# Patient Record
Sex: Male | Born: 1995 | Race: Black or African American | Hispanic: No | Marital: Single | Smoking: Never smoker
Health system: Southern US, Community
[De-identification: ages and names within clinical notes are randomized; demographics above are authoritative.]

---

## 2017-06-04 DIAGNOSIS — R109 Unspecified abdominal pain: Secondary | ICD-10-CM | POA: Insufficient documentation

## 2017-06-04 DIAGNOSIS — R110 Nausea: Secondary | ICD-10-CM | POA: Insufficient documentation

## 2017-06-04 DIAGNOSIS — Z5321 Procedure and treatment not carried out due to patient leaving prior to being seen by health care provider: Secondary | ICD-10-CM | POA: Insufficient documentation

## 2017-06-05 ENCOUNTER — Encounter (HOSPITAL_COMMUNITY)
Admission: EM | Disposition: A | Discharge status: Home / Self Care | Payer: Self-pay | Source: Home / Self Care | Attending: Emergency Medicine

## 2017-06-05 ENCOUNTER — Encounter (HOSPITAL_COMMUNITY): Payer: Self-pay | Admitting: *Deleted

## 2017-06-05 ENCOUNTER — Observation Stay (HOSPITAL_COMMUNITY): Payer: Self-pay | Admitting: Anesthesiology

## 2017-06-05 ENCOUNTER — Emergency Department (HOSPITAL_COMMUNITY): Payer: Self-pay

## 2017-06-05 ENCOUNTER — Emergency Department (HOSPITAL_COMMUNITY)
Admission: EM | Admit: 2017-06-05 | Discharge: 2017-06-05 | Disposition: A | Discharge status: Home / Self Care | Payer: Self-pay | Attending: Emergency Medicine | Admitting: Emergency Medicine

## 2017-06-05 ENCOUNTER — Observation Stay (HOSPITAL_COMMUNITY)
Admission: EM | Admit: 2017-06-05 | Discharge: 2017-06-06 | Disposition: A | Discharge status: Home / Self Care | Payer: Self-pay | Attending: Surgery | Admitting: Surgery

## 2017-06-05 DIAGNOSIS — K353 Acute appendicitis with localized peritonitis, without perforation or gangrene: Secondary | ICD-10-CM

## 2017-06-05 DIAGNOSIS — K3580 Unspecified acute appendicitis: Secondary | ICD-10-CM | POA: Diagnosis present

## 2017-06-05 HISTORY — PX: LAPAROSCOPIC APPENDECTOMY: SHX408

## 2017-06-05 LAB — COMPREHENSIVE METABOLIC PANEL
ALK PHOS: 71 U/L (ref 38–126)
ALT: 17 U/L (ref 17–63)
ANION GAP: 9 (ref 5–15)
AST: 23 U/L (ref 15–41)
Albumin: 4.7 g/dL (ref 3.5–5.0)
BUN: 11 mg/dL (ref 6–20)
CALCIUM: 10.1 mg/dL (ref 8.9–10.3)
CO2: 27 mmol/L (ref 22–32)
CREATININE: 1.01 mg/dL (ref 0.61–1.24)
Chloride: 100 mmol/L — ABNORMAL LOW (ref 101–111)
Glucose, Bld: 85 mg/dL (ref 65–99)
Potassium: 3.9 mmol/L (ref 3.5–5.1)
SODIUM: 136 mmol/L (ref 135–145)
Total Bilirubin: 0.8 mg/dL (ref 0.3–1.2)
Total Protein: 8.3 g/dL — ABNORMAL HIGH (ref 6.5–8.1)

## 2017-06-05 LAB — URINALYSIS, ROUTINE W REFLEX MICROSCOPIC
Bilirubin Urine: NEGATIVE
Glucose, UA: NEGATIVE mg/dL
HGB URINE DIPSTICK: NEGATIVE
Ketones, ur: NEGATIVE mg/dL
LEUKOCYTES UA: NEGATIVE
NITRITE: NEGATIVE
PROTEIN: NEGATIVE mg/dL
SPECIFIC GRAVITY, URINE: 1.015 (ref 1.005–1.030)
pH: 7 (ref 5.0–8.0)

## 2017-06-05 LAB — CBC
HCT: 45 % (ref 39.0–52.0)
HEMATOCRIT: 45.1 % (ref 39.0–52.0)
HEMOGLOBIN: 14.6 g/dL (ref 13.0–17.0)
HEMOGLOBIN: 15.4 g/dL (ref 13.0–17.0)
MCH: 25.9 pg — ABNORMAL LOW (ref 26.0–34.0)
MCH: 26.9 pg (ref 26.0–34.0)
MCHC: 32.4 g/dL (ref 30.0–36.0)
MCHC: 34.1 g/dL (ref 30.0–36.0)
MCV: 78.8 fL (ref 78.0–100.0)
MCV: 79.8 fL (ref 78.0–100.0)
PLATELETS: 270 10*3/uL (ref 150–400)
Platelets: 264 10*3/uL (ref 150–400)
RBC: 5.64 MIL/uL (ref 4.22–5.81)
RBC: 5.72 MIL/uL (ref 4.22–5.81)
RDW: 13 % (ref 11.5–15.5)
RDW: 13.6 % (ref 11.5–15.5)
WBC: 11.3 10*3/uL — ABNORMAL HIGH (ref 4.0–10.5)
WBC: 11.6 10*3/uL — AB (ref 4.0–10.5)

## 2017-06-05 LAB — LIPASE, BLOOD: LIPASE: 20 U/L (ref 11–51)

## 2017-06-05 SURGERY — APPENDECTOMY, LAPAROSCOPIC
Anesthesia: General | Site: Abdomen

## 2017-06-05 MED ORDER — METOPROLOL TARTRATE 5 MG/5ML IV SOLN: 5.0000 mg | Freq: Four times a day (QID) | INTRAVENOUS | Status: DC | PRN

## 2017-06-05 MED ORDER — KCL IN DEXTROSE-NACL 20-5-0.45 MEQ/L-%-% IV SOLN: INTRAVENOUS | Status: DC

## 2017-06-05 MED ORDER — OXYCODONE HCL 5 MG PO TABS
5.0000 mg | ORAL_TABLET | ORAL | Status: DC | PRN
  Administered 2017-06-05: 5 mg via ORAL
  Filled 2017-06-05: qty 1

## 2017-06-05 MED ORDER — DEXTROSE 5 % IV SOLN
2.0000 g | INTRAVENOUS | Status: DC
  Administered 2017-06-06: 2 g via INTRAVENOUS
  Filled 2017-06-05: qty 2

## 2017-06-05 MED ORDER — IOPAMIDOL (ISOVUE-300) INJECTION 61%
INTRAVENOUS | Status: AC
  Administered 2017-06-05: 100 mL
  Filled 2017-06-05: qty 100

## 2017-06-05 MED ORDER — ONDANSETRON 4 MG PO TBDP: 8.0000 mg | ORAL_TABLET | Freq: Once | ORAL | Status: DC

## 2017-06-05 MED ORDER — ONDANSETRON 4 MG PO TBDP
4.0000 mg | ORAL_TABLET | Freq: Once | ORAL | Status: AC
  Administered 2017-06-05: 4 mg via ORAL

## 2017-06-05 MED ORDER — 0.9 % SODIUM CHLORIDE (POUR BTL) OPTIME
TOPICAL | Status: DC | PRN
  Administered 2017-06-05: 1000 mL

## 2017-06-05 MED ORDER — DOCUSATE SODIUM 100 MG PO CAPS
100.0000 mg | ORAL_CAPSULE | Freq: Two times a day (BID) | ORAL | Status: DC
  Administered 2017-06-05 – 2017-06-06 (×2): 100 mg via ORAL
  Filled 2017-06-05 (×2): qty 1

## 2017-06-05 MED ORDER — PROPOFOL 10 MG/ML IV BOLUS
INTRAVENOUS | Status: DC | PRN
  Administered 2017-06-05: 200 mg via INTRAVENOUS

## 2017-06-05 MED ORDER — MIDAZOLAM HCL 5 MG/5ML IJ SOLN
INTRAMUSCULAR | Status: DC | PRN
  Administered 2017-06-05: 2 mg via INTRAVENOUS

## 2017-06-05 MED ORDER — FENTANYL CITRATE (PF) 100 MCG/2ML IJ SOLN
INTRAMUSCULAR | Status: DC | PRN
  Administered 2017-06-05: 100 ug via INTRAVENOUS
  Administered 2017-06-05 (×3): 50 ug via INTRAVENOUS

## 2017-06-05 MED ORDER — SODIUM CHLORIDE 0.9 % IR SOLN
Status: DC | PRN
  Administered 2017-06-05: 1000 mL

## 2017-06-05 MED ORDER — ACETAMINOPHEN 325 MG PO TABS: 650.0000 mg | ORAL_TABLET | Freq: Four times a day (QID) | ORAL | Status: DC | PRN

## 2017-06-05 MED ORDER — SODIUM CHLORIDE 0.9 % IV BOLUS (SEPSIS)
1000.0000 mL | Freq: Once | INTRAVENOUS | Status: AC
  Administered 2017-06-05: 1000 mL via INTRAVENOUS

## 2017-06-05 MED ORDER — LIDOCAINE HCL (CARDIAC) 20 MG/ML IV SOLN
INTRAVENOUS | Status: DC | PRN
  Administered 2017-06-05: 60 mg via INTRAVENOUS

## 2017-06-05 MED ORDER — DEXTROSE 5 % IV SOLN
1.0000 g | Freq: Once | INTRAVENOUS | Status: AC
  Administered 2017-06-05: 1 g via INTRAVENOUS
  Filled 2017-06-05: qty 10

## 2017-06-05 MED ORDER — DEXTROSE 5 % IV SOLN
2.0000 g | Freq: Once | INTRAVENOUS | Status: AC
  Administered 2017-06-05: 2 g via INTRAVENOUS
  Filled 2017-06-05: qty 2

## 2017-06-05 MED ORDER — ONDANSETRON HCL 4 MG/2ML IJ SOLN: 4.0000 mg | Freq: Four times a day (QID) | INTRAMUSCULAR | Status: DC | PRN

## 2017-06-05 MED ORDER — METRONIDAZOLE IN NACL 5-0.79 MG/ML-% IV SOLN
500.0000 mg | Freq: Once | INTRAVENOUS | Status: AC
  Administered 2017-06-05: 500 mg via INTRAVENOUS
  Filled 2017-06-05: qty 100

## 2017-06-05 MED ORDER — LACTATED RINGERS IV SOLN
INTRAVENOUS | Status: DC
  Administered 2017-06-05 (×2): via INTRAVENOUS

## 2017-06-05 MED ORDER — ROCURONIUM BROMIDE 100 MG/10ML IV SOLN
INTRAVENOUS | Status: DC | PRN
  Administered 2017-06-05: 50 mg via INTRAVENOUS

## 2017-06-05 MED ORDER — ONDANSETRON 4 MG PO TBDP: 4.0000 mg | ORAL_TABLET | Freq: Four times a day (QID) | ORAL | Status: DC | PRN

## 2017-06-05 MED ORDER — KCL IN DEXTROSE-NACL 20-5-0.9 MEQ/L-%-% IV SOLN
INTRAVENOUS | Status: DC
  Administered 2017-06-05: 17:00:00 via INTRAVENOUS
  Filled 2017-06-05 (×2): qty 1000

## 2017-06-05 MED ORDER — PROPOFOL 10 MG/ML IV BOLUS
INTRAVENOUS | Status: AC
  Filled 2017-06-05: qty 20

## 2017-06-05 MED ORDER — ACETAMINOPHEN 650 MG RE SUPP: 650.0000 mg | Freq: Four times a day (QID) | RECTAL | Status: DC | PRN

## 2017-06-05 MED ORDER — FENTANYL CITRATE (PF) 250 MCG/5ML IJ SOLN
INTRAMUSCULAR | Status: AC
  Filled 2017-06-05: qty 5

## 2017-06-05 MED ORDER — BUPIVACAINE-EPINEPHRINE 0.5% -1:200000 IJ SOLN
INTRAMUSCULAR | Status: DC | PRN
  Administered 2017-06-05: 2 mL

## 2017-06-05 MED ORDER — ONDANSETRON 4 MG PO TBDP
ORAL_TABLET | ORAL | Status: AC
  Filled 2017-06-05: qty 2

## 2017-06-05 MED ORDER — ACETAMINOPHEN 500 MG PO TABS
1000.0000 mg | ORAL_TABLET | Freq: Four times a day (QID) | ORAL | Status: DC
  Administered 2017-06-05 – 2017-06-06 (×4): 1000 mg via ORAL
  Filled 2017-06-05 (×4): qty 2

## 2017-06-05 MED ORDER — KCL IN DEXTROSE-NACL 20-5-0.9 MEQ/L-%-% IV SOLN: INTRAVENOUS | Status: DC

## 2017-06-05 MED ORDER — PANTOPRAZOLE SODIUM 40 MG IV SOLR
40.0000 mg | Freq: Every day | INTRAVENOUS | Status: DC
  Administered 2017-06-05: 40 mg via INTRAVENOUS
  Filled 2017-06-05: qty 40

## 2017-06-05 MED ORDER — ONDANSETRON HCL 4 MG/2ML IJ SOLN
INTRAMUSCULAR | Status: DC | PRN
  Administered 2017-06-05: 4 mg via INTRAVENOUS

## 2017-06-05 MED ORDER — MIDAZOLAM HCL 2 MG/2ML IJ SOLN
INTRAMUSCULAR | Status: AC
  Filled 2017-06-05: qty 2

## 2017-06-05 MED ORDER — KETOROLAC TROMETHAMINE 30 MG/ML IJ SOLN
INTRAMUSCULAR | Status: DC | PRN
  Administered 2017-06-05: 30 mg via INTRAVENOUS

## 2017-06-05 MED ORDER — OXYCODONE HCL 5 MG PO TABS: 5.0000 mg | ORAL_TABLET | ORAL | Status: DC | PRN

## 2017-06-05 MED ORDER — ENOXAPARIN SODIUM 40 MG/0.4ML ~~LOC~~ SOLN
40.0000 mg | SUBCUTANEOUS | Status: DC
  Filled 2017-06-05: qty 0.4

## 2017-06-05 MED ORDER — FENTANYL CITRATE (PF) 100 MCG/2ML IJ SOLN: 50.0000 ug | INTRAMUSCULAR | Status: DC | PRN

## 2017-06-05 MED ORDER — METRONIDAZOLE IN NACL 5-0.79 MG/ML-% IV SOLN
500.0000 mg | Freq: Three times a day (TID) | INTRAVENOUS | Status: DC
  Administered 2017-06-05 – 2017-06-06 (×2): 500 mg via INTRAVENOUS
  Filled 2017-06-05 (×4): qty 100

## 2017-06-05 MED ORDER — BUPIVACAINE-EPINEPHRINE (PF) 0.5% -1:200000 IJ SOLN
INTRAMUSCULAR | Status: AC
  Filled 2017-06-05: qty 30

## 2017-06-05 MED ORDER — KETOROLAC TROMETHAMINE 30 MG/ML IJ SOLN
30.0000 mg | Freq: Four times a day (QID) | INTRAMUSCULAR | Status: DC
  Administered 2017-06-05 – 2017-06-06 (×4): 30 mg via INTRAVENOUS
  Filled 2017-06-05 (×4): qty 1

## 2017-06-05 MED ORDER — HEMOSTATIC AGENTS (NO CHARGE) OPTIME
TOPICAL | Status: DC | PRN
  Administered 2017-06-05: 1 via TOPICAL

## 2017-06-05 MED ORDER — SUGAMMADEX SODIUM 200 MG/2ML IV SOLN
INTRAVENOUS | Status: DC | PRN
  Administered 2017-06-05: 100 mg via INTRAVENOUS

## 2017-06-05 MED ORDER — HYDROMORPHONE HCL 1 MG/ML IJ SOLN: 0.5000 mg | INTRAMUSCULAR | Status: DC | PRN

## 2017-06-05 SURGICAL SUPPLY — 40 items
APPLIER CLIP ROT 10 11.4 M/L (STAPLE)
BLADE CLIPPER SURG (BLADE) ×3 IMPLANT
CANISTER SUCT 3000ML PPV (MISCELLANEOUS) ×3 IMPLANT
CHLORAPREP W/TINT 26ML (MISCELLANEOUS) ×3 IMPLANT
CLIP APPLIE ROT 10 11.4 M/L (STAPLE) IMPLANT
COVER SURGICAL LIGHT HANDLE (MISCELLANEOUS) ×3 IMPLANT
CUTTER FLEX LINEAR 45M (STAPLE) ×3 IMPLANT
DERMABOND ADVANCED (GAUZE/BANDAGES/DRESSINGS) ×2
DERMABOND ADVANCED .7 DNX12 (GAUZE/BANDAGES/DRESSINGS) ×1 IMPLANT
DRAPE WARM FLUID 44X44 (DRAPE) IMPLANT
ELECT REM PT RETURN 9FT ADLT (ELECTROSURGICAL) ×3
ELECTRODE REM PT RTRN 9FT ADLT (ELECTROSURGICAL) ×1 IMPLANT
ENDOLOOP SUT PDS II  0 18 (SUTURE)
ENDOLOOP SUT PDS II 0 18 (SUTURE) IMPLANT
GLOVE BIO SURGEON STRL SZ8 (GLOVE) ×3 IMPLANT
GLOVE BIOGEL PI IND STRL 8 (GLOVE) ×1 IMPLANT
GLOVE BIOGEL PI INDICATOR 8 (GLOVE) ×2
GOWN STRL REUS W/ TWL LRG LVL3 (GOWN DISPOSABLE) ×2 IMPLANT
GOWN STRL REUS W/ TWL XL LVL3 (GOWN DISPOSABLE) ×1 IMPLANT
GOWN STRL REUS W/TWL LRG LVL3 (GOWN DISPOSABLE) ×4
GOWN STRL REUS W/TWL XL LVL3 (GOWN DISPOSABLE) ×2
HEMOSTAT SNOW SURGICEL 2X4 (HEMOSTASIS) ×3 IMPLANT
KIT BASIN OR (CUSTOM PROCEDURE TRAY) ×3 IMPLANT
KIT ROOM TURNOVER OR (KITS) ×3 IMPLANT
NS IRRIG 1000ML POUR BTL (IV SOLUTION) ×3 IMPLANT
PAD ARMBOARD 7.5X6 YLW CONV (MISCELLANEOUS) ×6 IMPLANT
POUCH SPECIMEN RETRIEVAL 10MM (ENDOMECHANICALS) ×3 IMPLANT
RELOAD STAPLE TA45 3.5 REG BLU (ENDOMECHANICALS) ×3 IMPLANT
SCISSORS LAP 5X35 DISP (ENDOMECHANICALS) ×3 IMPLANT
SET IRRIG TUBING LAPAROSCOPIC (IRRIGATION / IRRIGATOR) ×3 IMPLANT
SHEARS HARMONIC ACE PLUS 36CM (ENDOMECHANICALS) ×3 IMPLANT
SPECIMEN JAR SMALL (MISCELLANEOUS) ×3 IMPLANT
SUT MON AB 4-0 PC3 18 (SUTURE) ×3 IMPLANT
TOWEL OR 17X24 6PK STRL BLUE (TOWEL DISPOSABLE) ×3 IMPLANT
TOWEL OR 17X26 10 PK STRL BLUE (TOWEL DISPOSABLE) ×3 IMPLANT
TRAY FOLEY CATH SILVER 16FR (SET/KITS/TRAYS/PACK) ×3 IMPLANT
TRAY LAPAROSCOPIC MC (CUSTOM PROCEDURE TRAY) ×3 IMPLANT
TROCAR XCEL BLADELESS 5X75MML (TROCAR) ×6 IMPLANT
TROCAR XCEL BLUNT TIP 100MML (ENDOMECHANICALS) ×3 IMPLANT
TUBING INSUFFLATION (TUBING) ×3 IMPLANT

## 2017-06-05 NOTE — Discharge Instructions (Signed)
Please arrive at least 30 min before your appointment to complete your check in paperwork.  If you are unable to arrive 30 min prior to your appointment time we may have to cancel or reschedule you. ° °LAPAROSCOPIC SURGERY: POST OP INSTRUCTIONS  °1. DIET: Follow a light bland diet the first 24 hours after arrival home, such as soup, liquids, crackers, etc. Be sure to include lots of fluids daily. Avoid fast food or heavy meals as your are more likely to get nauseated. Eat a low fat the next few days after surgery.  °2. Take your usually prescribed home medications unless otherwise directed. °3. PAIN CONTROL:  °1. Pain is best controlled by a usual combination of three different methods TOGETHER:  °1. Ice/Heat °2. Over the counter pain medication °3. Prescription pain medication °2. Most patients will experience some swelling and bruising around the incisions. Ice packs or heating pads (30-60 minutes up to 6 times a day) will help. Use ice for the first few days to help decrease swelling and bruising, then switch to heat to help relax tight/sore spots and speed recovery. Some people prefer to use ice alone, heat alone, alternating between ice & heat. Experiment to what works for you. Swelling and bruising can take several weeks to resolve.  °3. It is helpful to take an over-the-counter pain medication regularly for the first few weeks. Choose one of the following that works best for you:  °1. Naproxen (Aleve, etc) Two 220mg tabs twice a day °2. Ibuprofen (Advil, etc) Three 200mg tabs four times a day (every meal & bedtime) °3. Acetaminophen (Tylenol, etc) 500-650mg four times a day (every meal & bedtime) °4. A prescription for pain medication (such as oxycodone, hydrocodone, etc) should be given to you upon discharge. Take your pain medication as prescribed.  °1. If you are having problems/concerns with the prescription medicine (does not control pain, nausea, vomiting, rash, itching, etc), please call us [phone]  [phone] to see if we need to switch you to a different pain medicine that will work better for you and/or control your side effect better. °2. If you need a refill on your pain medication, please contact your pharmacy. They will contact our office to request authorization. Prescriptions will not be filled after 5 pm or on week-ends. °4. Avoid getting constipated. Between the surgery and the pain medications, it is common to experience some constipation. Increasing fluid intake and taking a fiber supplement (such as Metamucil, Citrucel, FiberCon, MiraLax, etc) 1-2 times a day regularly will usually help prevent this problem from occurring. A mild laxative (prune juice, Milk of Magnesia, MiraLax, etc) should be taken according to package directions if there are no bowel movements after 48 hours.  °5. Watch out for diarrhea. If you have many loose bowel movements, simplify your diet to bland foods & liquids for a few days. Stop any stool softeners and decrease your fiber supplement. Switching to mild anti-diarrheal medications (Kayopectate, Pepto Bismol) can help. If this worsens or does not improve, please call us. °6. Wash / shower every day. You may shower over the dressings as they are waterproof. Continue to shower over incision(s) after the dressing is off. °7. Remove your waterproof bandages 5 days after surgery. You may leave the incision open to air. You may replace a dressing/Band-Aid to cover the incision for comfort if you wish.  °8. ACTIVITIES as tolerated:  °1. You may resume regular (light) daily activities beginning the next day--such as daily self-care, walking, climbing stairs--gradually   increasing activities as tolerated. If you can walk 30 minutes without difficulty, it is safe to try more intense activity such as jogging, treadmill, bicycling, low-impact aerobics, swimming, etc. °2. Save the most intensive and strenuous activity for last such as sit-ups, heavy lifting, contact sports, etc Refrain  from any heavy lifting or straining until you are off narcotics for pain control.  °3. DO NOT PUSH THROUGH PAIN. Let pain be your guide: If it hurts to do something, don't do it. Pain is your body warning you to avoid that activity for another week until the pain goes down. °4. You may drive when you are no longer taking prescription pain medication, you can comfortably wear a seatbelt, and you can safely maneuver your car and apply brakes. °5. You may have sexual intercourse when it is comfortable.  °9. FOLLOW UP in our office  °1. Please call [facility] at [phone] to set up an appointment to see your surgeon in the office for a follow-up appointment approximately 2-3 weeks after your surgery. °2. Make sure that you call for this appointment the day you arrive home to insure a convenient appointment time. °     10. IF YOU HAVE DISABILITY OR FAMILY LEAVE FORMS, BRING THEM TO THE               OFFICE FOR PROCESSING.  ° °WHEN TO CALL US [phone]:  °1. Poor pain control °2. Reactions / problems with new medications (rash/itching, nausea, etc)  °3. Fever over 101.5 F (38.5 C) °4. Inability to urinate °5. Nausea and/or vomiting °6. Worsening swelling or bruising °7. Continued bleeding from incision. °8. Increased pain, redness, or drainage from the incision ° °The clinic staff is available to answer your questions during regular business hours (8:30am-5pm). Please don’t hesitate to call and ask to speak to one of our nurses for clinical concerns.  °If you have a medical emergency, go to the nearest emergency room or call 911.  °A surgeon from [facility] is always on call at the hospitals  ° °[facility]  °[address], [address], [place], [place] [address] ?  °MAIN: [phone] ? TOLL FREE: [phone] ?  °FAX [phone]  °[email or web address] ° °

## 2017-06-05 NOTE — Transfer of Care (Signed)
Immediate Anesthesia Transfer of Care Note  Patient: Debby Bud Chittenden  Procedure(s) Performed: Procedure(s): APPENDECTOMY LAPAROSCOPIC (N/A)  Patient Location: PACU  Anesthesia Type:General  Level of Consciousness: sedated  Airway & Oxygen Therapy: Patient Spontanous Breathing and Patient connected to nasal cannula oxygen  Post-op Assessment: Report given to RN, Post -op Vital signs reviewed and stable and Patient moving all extremities  Post vital signs: Reviewed and stable  Last Vitals:  Vitals:   06/05/17 1015 06/05/17 1458  BP: (!) 142/86   Pulse: 87 (P) 77  Resp:  (!) (P) 21  Temp:  (P) 36.4 C  SpO2: 98% (P) 94%    Last Pain:  Vitals:   06/05/17 0831  TempSrc:   PainSc: 5          Complications: No apparent anesthesia complications

## 2017-06-05 NOTE — Interval H&P Note (Signed)
History and Physical Interval Note:  06/05/2017 1:20 PM  Randy Smith  has presented today for surgery, with the diagnosis of acute appendicitis  The various methods of treatment have been discussed with the patient and family. After consideration of risks, benefits and other options for treatment, the patient has consented to  Procedure(s): APPENDECTOMY LAPAROSCOPIC (N/A) as a surgical intervention .  The patient's history has been reviewed, patient examined, no change in status, stable for surgery.  I have reviewed the patient's chart and labs.  Questions were answered to the patient's satisfaction.     Embry Manrique A.

## 2017-06-05 NOTE — ED Notes (Signed)
Surgical PA at bedside  

## 2017-06-05 NOTE — ED Notes (Signed)
Consent signed and left at bedside with patient, patient instructed to undress prior to going up for surgery.

## 2017-06-05 NOTE — ED Notes (Signed)
Patient transported to CT 

## 2017-06-05 NOTE — ED Triage Notes (Signed)
The pt is c/o abd pain for 2 hours with nausea

## 2017-06-05 NOTE — Anesthesia Preprocedure Evaluation (Signed)
Anesthesia Evaluation  Patient identified by MRN, date of birth, ID band Patient awake    Reviewed: Allergy & Precautions, NPO status , Patient's Chart, lab work & pertinent test results  Airway Mallampati: II  TM Distance: >3 FB Neck ROM: Full    Dental no notable dental hx.    Pulmonary neg pulmonary ROS,    Pulmonary exam normal breath sounds clear to auscultation       Cardiovascular negative cardio ROS Normal cardiovascular exam Rhythm:Regular Rate:Normal     Neuro/Psych negative neurological ROS  negative psych ROS   GI/Hepatic negative GI ROS, Neg liver ROS,   Endo/Other  negative endocrine ROS  Renal/GU negative Renal ROS     Musculoskeletal negative musculoskeletal ROS (+)   Abdominal   Peds  Hematology negative hematology ROS (+)   Anesthesia Other Findings   Reproductive/Obstetrics                             Anesthesia Physical Anesthesia Plan  ASA: I and emergent  Anesthesia Plan: General   Post-op Pain Management:    Induction: Intravenous and Cricoid pressure planned  PONV Risk Score and Plan: 3 and Ondansetron, Dexamethasone and Midazolam  Airway Management Planned: Oral ETT  Additional Equipment:   Intra-op Plan:   Post-operative Plan: Extubation in OR  Informed Consent: I have reviewed the patients History and Physical, chart, labs and discussed the procedure including the risks, benefits and alternatives for the proposed anesthesia with the patient or authorized representative who has indicated his/her understanding and acceptance.   Dental advisory given  Plan Discussed with: CRNA  Anesthesia Plan Comments:         Anesthesia Quick Evaluation

## 2017-06-05 NOTE — ED Provider Notes (Signed)
[facility]-EMERGENCY DEPT Provider Note   CSN: [id number] Arrival date & time: 06/05/17  4098     History   Chief Complaint Chief Complaint  Patient presents with  . Abdominal Pain    HPI Randy Smith is a 21 y.o. male.  Pt presents to the ED today with RLQ abd pain.  Pain started around 1800 yesterday.  He initially came in around midnight, but due to the long wait, he went home.  Blood was drawn while he was in the waiting room.  Pt left, then came back this morning with continued pain.        History reviewed. No pertinent past medical history.  There are no active problems to display for this patient.   History reviewed. No pertinent surgical history.     Home Medications    Prior to Admission medications   Medication Sig Start Date End Date Taking? Authorizing Provider  aspirin 325 MG tablet Take 650 mg by mouth once.   Yes [provider]  ibuprofen (ADVIL,MOTRIN) 200 MG tablet Take 400 mg by mouth every 6 (six) hours as needed for headache or mild pain.   Yes [provider]    Family History History reviewed. No pertinent family history.  Social History Social History  Substance Use Topics  . Smoking status: Never Smoker  . Smokeless tobacco: Never Used  . Alcohol use Yes     Allergies   Patient has no known allergies.   Review of Systems Review of Systems  Gastrointestinal: Positive for abdominal pain.  All other systems reviewed and are negative.    Physical Exam Updated Vital Signs BP (!) 142/86   Pulse 87   Temp 98.8 F (37.1 C) (Oral)   Resp 20   Ht 6' (1.829 m)   Wt 81.6 kg (180 lb)   SpO2 98%   BMI 24.41 kg/m   Physical Exam  Constitutional: He is oriented to person, place, and time. He appears well-developed and well-nourished.  HENT:  Head: Normocephalic and atraumatic.  Right Ear: External ear normal.  Left Ear: External ear normal.  Nose: Nose normal.  Mouth/Throat: Oropharynx is clear and moist.    Eyes: Pupils are equal, round, and reactive to light. Conjunctivae and EOM are normal.  Neck: Normal range of motion. Neck supple.  Cardiovascular: Normal rate, regular rhythm, normal heart sounds and intact distal pulses.   Pulmonary/Chest: Effort normal and breath sounds normal.  Abdominal: There is tenderness in the right lower quadrant.  Musculoskeletal: Normal range of motion.  Neurological: He is alert and oriented to person, place, and time.  Skin: Skin is warm.  Psychiatric: He has a normal mood and affect. His behavior is normal. Judgment and thought content normal.  Nursing note and vitals reviewed.    ED Treatments / Results  Labs (all labs ordered are listed, but only abnormal results are displayed) Labs Reviewed  CBC - Abnormal; Notable for the following:       Result Value   WBC 11.3 (*)    All other components within normal limits    EKG  EKG Interpretation None       Radiology Ct Abdomen Pelvis W Contrast  Result Date: 06/05/2017 CLINICAL DATA:  Sharp pains in the lower abdomen on both sides, worse on the right. Symptoms began 6 p.m. yesterday. EXAM: CT ABDOMEN AND PELVIS WITH CONTRAST TECHNIQUE: Multidetector CT imaging of the abdomen and pelvis was performed using the standard protocol following bolus administration of intravenous contrast.  CONTRAST:  ISOVUE-300 IOPAMIDOL (ISOVUE-300) INJECTION 61% COMPARISON:  None. FINDINGS: Lower chest:  No contributory findings. Hepatobiliary: No focal liver abnormality.No evidence of biliary obstruction or stone. Pancreas: Unremarkable. Spleen: Unremarkable. Adrenals/Urinary Tract: Negative adrenals. No hydronephrosis or stone. Unremarkable bladder. Stomach/Bowel: The appendix extends medially then inferiorly from the cecum and shows wall thickening within outer wall diameter of 11 mm. Some gas is present within the distal lumen, but it is primarily fluid-filled. Stone like calcification seen at the level of the cecum  but not at the appendiceal orifice. No mesoappendix fat inflammation. No perforation or abscess. Negative for bowel obstruction. Vascular/Lymphatic: No acute vascular abnormality. Mild enlargement of ileo colic lymph nodes considered reactive in this setting. Reproductive:No pathologic findings. Other: No ascites or pneumoperitoneum. Musculoskeletal: No acute abnormalities. Two retained BBs posterior to the left proximal femur. IMPRESSION: Findings consistent with early appendicitis in the appropriate clinical setting. No perforation or abscess. Electronically Signed   By: Marnee Spring M.D.   On: 06/05/2017 10:40    Procedures Procedures (including critical care time)  Medications Ordered in ED Medications  ondansetron (ZOFRAN-ODT) 4 MG disintegrating tablet (not administered)  cefTRIAXone (ROCEPHIN) 2 g in dextrose 5 % 50 mL IVPB (not administered)    And  metroNIDAZOLE (FLAGYL) IVPB 500 mg (not administered)  ondansetron (ZOFRAN-ODT) disintegrating tablet 4 mg (4 mg Oral Given 06/05/17 0655)  sodium chloride 0.9 % bolus 1,000 mL (1,000 mLs Intravenous New Bag/Given 06/05/17 0840)  iopamidol (ISOVUE-300) 61 % injection (100 mLs  Contrast Given 06/05/17 1024)     Initial Impression / Assessment and Plan / ED Course  I have reviewed the triage vital signs and the nursing notes.  Pertinent labs & imaging results that were available during my care of the patient were reviewed by me and considered in my medical decision making (see chart for details).   Pt has acute appendicitis on CT.  He was given rocephin and flagyl.  He was d/w surgery who will see him and take him to the OR.   Final Clinical Impressions(s) / ED Diagnoses   Final diagnoses:  Acute appendicitis with localized peritonitis    New Prescriptions New Prescriptions   No medications on file     Jacalyn Lefevre, MD 06/05/17 1109

## 2017-06-05 NOTE — Op Note (Signed)
Appendectomy, Lap, Procedure Note  Indications: The patient presented with a history of right-sided abdominal pain. A CT revealed findings consistent with acute appendicitis. The procedure has been discussed with the patient.  Alternative therapies have been discussed with the patient.  Operative risks include bleeding,  Infection,  Organ injury,  Nerve injury,  Blood vessel injury,  DVT,  Pulmonary embolism,  Death,  And possible reoperation.  Medical management risks include worsening of present situation.  The success of the procedure is 50 -90 % at treating patients symptoms.  The patient understands and agrees to proceed.       Pre-operative Diagnosis: Acute appendicitis without mention of peritonitis  Post-operative Diagnosis: Same  Surgeon: Detron Carras A.   Assistants: NONE   Anesthesia: General endotracheal anesthesia and Local anesthesia 0.25.% bupivacaine, with epinephrine  ASA Class: 1  Procedure Details  The patient was seen again in the Holding Room. The risks, benefits, complications, treatment options, and expected outcomes were discussed with the patient and/or family. The possibilities of reaction to medication, pulmonary aspiration, perforation of viscus, bleeding, recurrent infection, finding a normal appendix, the need for additional procedures, failure to diagnose a condition, and creating a complication requiring transfusion or operation were discussed. There was concurrence with the proposed plan and informed consent was obtained. The site of surgery was properly noted/marked. The patient was taken to Operating Room, identified as Randy Smith and the procedure verified as Appendectomy. A Time Out was held and the above information confirmed.  The patient was placed in the supine position and general anesthesia was induced, along with placement of orogastric tube, Venodyne boots, and a Foley catheter. The abdomen was prepped and draped in a sterile fashion. A one  centimeter infraumbilical incision was made and the peritoneal cavity was accessed using the OPEN  technique. The pneumoperitoneum was then established to steady pressure of 12 mmHg. A 12 mm port was placed through the umbilical incision. Additional 5 mm cannulas then placed in the midline  of the abdomen and half way between the umbilicus and xyphoid under direct vision. A careful evaluation of the entire abdomen was carried out. The patient was placed in Trendelenburg and left lateral decubitus position. The small intestines were retracted in the cephalad and left lateral direction away from the pelvis and right lower quadrant. The patient was found to have an enlarged and inflamed appendix that was extending into the pelvis. There was no evidence of perforation.  The appendix was carefully dissected. A window was made in the mesoappendix at the base of the appendix. A harmonic scalpel was used across the mesoappendix. The appendix was divided at its base using an endo-GIA stapler. Minimal appendiceal stump was left in place. There was no evidence of bleeding, leakage, or complication after division of the appendix. Irrigation was also performed and irrigate suctioned from the abdomen as well.  The umbilical port site was closed using 0 vicryl pursestring sutures fashion at the level of the fascia. The trocar site skin wounds were closed using skin staples.  Instrument, sponge, and needle counts were correct at the conclusion of the case.   Findings: The appendix was found to be inflamed. There were not signs of necrosis.  There was not perforation. There was not abscess formation.  Estimated Blood Loss:  less than 50 mL         Drains: NONE         Total IV Fluids: PER ANESTHESIA  RECORD  Specimens: APPENDIX          Complications:  None; patient tolerated the procedure well.         Disposition: PACU - hemodynamically stable.         Condition: stable

## 2017-06-05 NOTE — ED Triage Notes (Signed)
C/o abd. Pain onset yest pm, states he was here earlier however didn;t want to wait, and left.

## 2017-06-05 NOTE — H&P (Signed)
[facility] Admission Note  Randy Smith [birth year]  [id number].    Requesting MD: Particia Nearing Chief Complaint/Reason for Consult: Acute appendicitis HPI:  Patient is a 21 y.o. Male who is here visiting from [place], [place]. He began experiencing sharp abdominal pain in his RLQ yesterday around 6-7 PM. Pain is constant, radiates across abdomen, exacerbated by movement. Associated nausea, vomiting with PO contrast. Denies fever, chills, chest pain, SOB, diarrhea, fatigue. 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 No other surgeries. Otherwise healthy, no daily medications. Patient reports occasional alcohol use, no tobacco use, no illicit drug use. 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   ROS: Review of Systems  Constitutional: Negative for chills, fever and malaise/fatigue.  Respiratory: Negative for cough, shortness of breath and wheezing.   Cardiovascular: Negative for chest pain and palpitations.  Gastrointestinal: Positive for abdominal pain (RLQ), nausea and vomiting. Negative for blood in stool, constipation and diarrhea.  Musculoskeletal: Negative for back pain and neck pain.  Neurological: Negative for dizziness and loss of consciousness.  All other systems reviewed and are negative.   History reviewed. No pertinent family history.  History reviewed. No pertinent past medical history.  History reviewed. No pertinent surgical history.  Social History:  reports that he has never smoked. He has never used smokeless tobacco. He reports that he drinks alcohol. His drug history is not on file.  Allergies: No Known Allergies   (Not in a hospital admission)  Blood pressure (!) 142/86, pulse 87, temperature 98.8 F (37.1 C), temperature source Oral, resp. rate 20, height 6' (1.829 m), weight 81.6 kg (180 lb), SpO2 98 %. Physical Exam: Physical Exam  Constitutional: He is oriented to person, place, and time. He appears  well-developed and well-nourished. He is cooperative.  Non-toxic appearance. No distress.  HENT:  Head: Normocephalic and atraumatic.  Right Ear: External ear normal.  Left Ear: External ear normal.  Nose: Nose normal.  Mouth/Throat: Mucous membranes are normal.  Eyes: Conjunctivae and lids are normal. No scleral icterus.  Neck: Normal range of motion. Neck supple. No thyromegaly present.  Cardiovascular: Normal rate, regular rhythm, S1 normal and S2 normal.   Pulses:      Radial pulses are 2+ on the right side, and 2+ on the left side.       Dorsalis pedis pulses are 2+ on the right side, and 2+ on the left side.  No lower extremity edema bilaterally  Pulmonary/Chest: Effort normal and breath sounds normal.  Abdominal: Soft. Normal appearance and bowel sounds are normal. He exhibits no distension and no mass. There is no hepatosplenomegaly. There is tenderness in the right lower quadrant. There is tenderness at McBurney's point. There is no rigidity, no rebound and no guarding. No hernia.  Musculoskeletal:  ROM intact in bilateral upper and lower extremities. No gross deformity in bilateral upper and lower extremities.   Neurological: He is alert and oriented to person, place, and time. He has normal strength. No sensory deficit.  Skin: Skin is warm, dry and intact. No rash noted. He is not diaphoretic.  Psychiatric: He has a normal mood and affect. His speech is normal and behavior is normal.    Results for orders placed or performed during the hospital encounter of 06/05/17 (from the past 48 hour(s))  CBC     Status: Abnormal   Collection Time: 06/05/17  8:41 AM  Result Value Ref Range   WBC 11.3 (H) 4.0 -  10.5 K/uL   RBC 5.72 4.22 - 5.81 MIL/uL   Hemoglobin 15.4 13.0 - 17.0 g/dL   HCT 04.5 40.9 - 81.1 %   MCV 78.8 78.0 - 100.0 fL   MCH 26.9 26.0 - 34.0 pg   MCHC 34.1 30.0 - 36.0 g/dL   RDW 91.4 78.2 - 95.6 %   Platelets 264 150 - 400 K/uL   Ct Abdomen Pelvis W  Contrast  Result Date: 06/05/2017 CLINICAL DATA:  Sharp pains in the lower abdomen on both sides, worse on the right. Symptoms began 6 p.m. yesterday. EXAM: CT ABDOMEN AND PELVIS WITH CONTRAST TECHNIQUE: Multidetector CT imaging of the abdomen and pelvis was performed using the standard protocol following bolus administration of intravenous contrast. CONTRAST:  ISOVUE-300 IOPAMIDOL (ISOVUE-300) INJECTION 61% COMPARISON:  None. FINDINGS: Lower chest:  No contributory findings. Hepatobiliary: No focal liver abnormality.No evidence of biliary obstruction or stone. Pancreas: Unremarkable. Spleen: Unremarkable. Adrenals/Urinary Tract: Negative adrenals. No hydronephrosis or stone. Unremarkable bladder. Stomach/Bowel: The appendix extends medially then inferiorly from the cecum and shows wall thickening within outer wall diameter of 11 mm. Some gas is present within the distal lumen, but it is primarily fluid-filled. Stone like calcification seen at the level of the cecum but not at the appendiceal orifice. No mesoappendix fat inflammation. No perforation or abscess. Negative for bowel obstruction. Vascular/Lymphatic: No acute vascular abnormality. Mild enlargement of ileo colic lymph nodes considered reactive in this setting. Reproductive:No pathologic findings. Other: No ascites or pneumoperitoneum. Musculoskeletal: No acute abnormalities. Two retained BBs posterior to the left proximal femur. IMPRESSION: Findings consistent with early appendicitis in the appropriate clinical setting. No perforation or abscess. Electronically Signed   By: Marnee Spring M.D.   On: 06/05/2017 10:40      Assessment/Plan Acute appendicitis - CT: appendix extends medially then inferiorly from the cecum and shows wall thickening within outer wall diameter of 11 mm. No mesoappendix fat inflammation. No perforation or abscess - WBC 11.3, afebrile - clinically has signs of acute appendicitis - NPO, IV abx, consent patient    FEN: NPO, IVF VTE: SCDs ID: ceftriaxone and flagyl  Plan: to OR later today for laparoscopic appendectomy  Wells Guiles, [facility] 06/05/2017, 11:24 AM Pager: [phone] Consults: [phone] Mon-Fri 7:00 am-4:30 pm Sat-Sun 7:00 am-11:30 am

## 2017-06-05 NOTE — Anesthesia Procedure Notes (Signed)
Procedure Name: Intubation Date/Time: 06/05/2017 1:58 PM Performed by: Clearnce Sorrel Pre-anesthesia Checklist: Patient identified, Emergency Drugs available, Suction available, Patient being monitored and Timeout performed Patient Re-evaluated:Patient Re-evaluated prior to induction Oxygen Delivery Method: Circle system utilized Preoxygenation: Pre-oxygenation with 100% oxygen Induction Type: IV induction and Cricoid Pressure applied Ventilation: Mask ventilation without difficulty Laryngoscope Size: Mac and 3 Grade View: Grade I Tube type: Oral Tube size: 7.5 mm Number of attempts: 1 Airway Equipment and Method: Stylet Placement Confirmation: ETT inserted through vocal cords under direct vision,  positive ETCO2 and breath sounds checked- equal and bilateral Secured at: 23 cm Tube secured with: Tape Dental Injury: Teeth and Oropharynx as per pre-operative assessment

## 2017-06-06 ENCOUNTER — Encounter (HOSPITAL_COMMUNITY): Payer: Self-pay

## 2017-06-06 LAB — HIV ANTIBODY (ROUTINE TESTING W REFLEX): HIV SCREEN 4TH GENERATION: NONREACTIVE

## 2017-06-06 LAB — BASIC METABOLIC PANEL
Anion gap: 4 — ABNORMAL LOW (ref 5–15)
BUN: 8 mg/dL (ref 6–20)
CHLORIDE: 108 mmol/L (ref 101–111)
CO2: 26 mmol/L (ref 22–32)
CREATININE: 1.12 mg/dL (ref 0.61–1.24)
Calcium: 9.1 mg/dL (ref 8.9–10.3)
GFR calc Af Amer: >60 mL/min (ref >60)
GFR calc non Af Amer: >60 mL/min (ref >60)
GLUCOSE: 91 mg/dL (ref 65–99)
POTASSIUM: 4.1 mmol/L (ref 3.5–5.1)
SODIUM: 138 mmol/L (ref 135–145)

## 2017-06-06 MED ORDER — OXYCODONE HCL 5 MG PO TABS: 5.0000 mg | ORAL_TABLET | ORAL | 0 refills | Status: AC | PRN

## 2017-06-06 NOTE — Progress Notes (Signed)
Discharge instructions gone over with patient. Home medications discussed. Prescription given. Follow up appointment to be made.  Diet,activity,  incisional care, and reasons to call the doctor discussed. Bowel regimen discussed. Patient verbalized understanding of instructions.

## 2017-06-06 NOTE — Discharge Summary (Signed)
Patient ID: Randy Smith [id number] 21 y.o. [birth year]  Admit date: 06/05/2017  Discharge date and time: 06/06/2017  Admitting Physician: Ernestene Mention  Discharge Physician: Ernestene Mention  Admission Diagnoses: Acute appendicitis with localized peritonitis [K35.3]  Discharge Diagnoses: same  Operations: Procedure(s): APPENDECTOMY LAPAROSCOPIC  Admission Condition: fair  Discharged Condition: good  Indication for Admission: Patient is a 21 y.o. Male who is here visiting from [place], [place]. He began experiencing sharp abdominal pain in his RLQ on 06/04/2017 around 6-7 PM. Pain is constant, radiates across abdomen, exacerbated by movement. Associated nausea, vomiting with PO contrast. Denies fever, chills, chest pain, SOB, diarrhea, fatigue. Patient had surgery for undescended testicle at age 71 or 63. No other surgeries. Otherwise healthy, no daily medications. Patient reports 21y.o. occasional alcohol use, no tobacco use, no illicit drug use. 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   Hospital Course: the patient was evaluated in the emergency department.abdominal exam revealed localized tenderness the right lower quadrant.  WBC 11,300.  CT scan showed thickened inflamed appendix without perforation or abscess.  He was given IV fluids and intravenous antibiotics.  He was taking operating room and underwent laparoscopic appendectomy.  There is no signs of gangrene or perforation.  Surgery was uneventful.  He was observed overnight and did very well.  He became ambulatory.  Had no trouble voiding.  Ate a regular breakfast and asked to go home.  Examination of the day of discharge revealed he was alert and friendly no distress.  Abdomen was soft.  All of the wounds looked good with minimal tenderness.  He was given instructions in diet and activities.  He was given a prescription for Percocet for pain.  He was asked to return to see [place] in the office in 2 weeks and an  appointment was made.  Consults: None  Significant Diagnostic Studies: surgical pathology, pending  Treatments:  and surgery: laparoscopic appendectomy  Disposition: Home  Patient Instructions:  Allergies as of 06/06/2017   No Known Allergies     Medication List    TAKE these medications   aspirin 325 MG tablet Take 650 mg by mouth once.   ibuprofen 200 MG tablet Commonly known as:  ADVIL,MOTRIN Take 400 mg by mouth every 6 (six) hours as needed for headache or mild pain.   oxyCODONE 5 MG immediate release tablet Commonly known as:  Oxy IR/ROXICODONE Take 1-2 tablets (5-10 mg total) by mouth every 4 (four) hours as needed for moderate pain.            Discharge Care Instructions        Start     Ordered   06/06/17 0000  oxyCODONE (OXY IR/ROXICODONE) 5 MG immediate release tablet  Every 4 hours PRN     06/06/17 1131   06/06/17 0000  Increase activity slowly     06/06/17 1131   06/06/17 0000  Diet - low sodium heart healthy     06/06/17 1131   06/06/17 0000  Discharge instructions    Comments:  [facility] ______[facility] LAPAROSCOPIC SURGERY: POST OP INSTRUCTIONS Always review your discharge instruction sheet given to you by the facility where your surgery was performed. IF YOU HAVE DISABILITY OR FAMILY LEAVE FORMS, YOU MUST BRING THEM TO THE OFFICE FOR PROCESSING.   DO NOT GIVE THEM TO YOUR DOCTOR.  A prescription for pain medication may be given to you upon discharge.  Take your pain medication as prescribed, if needed.  If  narcotic pain medicine is not needed, then you may take acetaminophen (Tylenol) or ibuprofen (Advil) as needed. Take your usually prescribed medications unless otherwise directed. If you need a refill on your pain medication, please contact your pharmacy.  They will contact our office to request authorization. Prescriptions will not be filled after 5pm or on week-ends. You should follow a light diet the first few days after  arrival home, such as soup and crackers, etc.  Be sure to include lots of fluids daily. Most patients will experience some swelling and bruising in the area of the incisions.  Ice packs will help.  Swelling and bruising can take several days to resolve.  It is common to experience some constipation if taking pain medication after surgery.  Increasing fluid intake and taking a stool softener (such as Colace) will usually help or prevent this problem from occurring.  A mild laxative (Milk of Magnesia or Miralax) should be taken according to package instructions if there are no bowel movements after 48 hours. Unless discharge instructions indicate otherwise, you may remove your bandages 24-48 hours after surgery, and you may shower at that time.  You may have steri-strips (small skin tapes) in place directly over the incision.  These strips should be left on the skin for 7-10 days.  If your surgeon used skin glue on the incision, you may shower in 24 hours.  The glue will flake off over the next 2-3 weeks.  Any sutures or staples will be removed at the office during your follow-up visit. ACTIVITIES:  You may resume regular (light) daily activities beginning the next day-such as daily self-care, walking, climbing stairs-gradually increasing activities as tolerated.  You may have sexual intercourse when it is comfortable.  Refrain from any heavy lifting or straining until approved by your doctor. You may drive when you are no longer taking prescription pain medication, you can comfortably wear a seatbelt, and you can safely maneuver your car and apply brakes. RETURN TO WORK:  __________________________________________________________ Bonita Quin should see your doctor in the office for a follow-up appointment approximately 2-3 weeks after your surgery.  Make sure that you call for this appointment within a day or two after you arrive home to insure a convenient appointment time. OTHER INSTRUCTIONS:  __________________________________________________________________________________________________________________________ __________________________________________________________________________________________________________________________ WHEN TO CALL YOUR DOCTOR: Fever over 101.0 Inability to urinate Continued bleeding from incision. Increased pain, redness, or drainage from the incision. Increasing abdominal pain  The clinic staff is available to answer your questions during regular business hours.  Please don't hesitate to call and ask to speak to one of the nurses for clinical concerns.  If you have a medical emergency, go to the nearest emergency room or call 911.  A surgeon from [facility] is always on call at the hospital. [address], [place], [place]  [address] ? [address] [address], [place], [place]   [address] [phone] ? [phone] ? FAX [phone] Web site: [email or web address]   06/06/17 1131   06/06/17 0000  May walk up steps     06/06/17 1131   06/06/17 0000  May shower / [place]     06/06/17 1131   06/06/17 0000  Driving Restrictions    Comments:  2-4 days   06/06/17 1131   06/06/17 0000  Lifting restrictions    Comments:  20 lbs for 3 weeks   06/06/17 1131   06/06/17 0000  No wound care     06/06/17 1131   06/06/17 0000  Call MD for:  persistant dizziness or light-headedness     06/06/17 1131   06/06/17 0000  Call MD for:  hives     06/06/17 1131   06/06/17 0000  Call MD for:  difficulty breathing, headache or visual disturbances     06/06/17 1131   06/06/17 0000  Call MD for:  redness, tenderness, or signs of infection (pain, swelling, redness, odor or green/yellow discharge around incision site)     06/06/17 1131   06/06/17 0000  Call MD for:  severe uncontrolled pain     06/06/17 1131   06/06/17 0000  Call MD for:  persistant nausea and vomiting     06/06/17 1131   06/06/17 0000  Call MD for:  temperature  >100.4     06/06/17 1131      Activity: no sports or heavy lifting for 3 weeks Diet: regular diet Wound Care: none needed  Follow-up:  With [facility] clinic in 2 weeks    Addendum: I logged onto the [facility] website and reviewed his prescription medication history.  Signed: Angelia Mould. Derrell Lolling, M.D., FACS General and minimally invasive surgery Breast and Colorectal Surgery  06/06/2017, 11:32 AM

## 2017-06-06 NOTE — Anesthesia Postprocedure Evaluation (Signed)
Anesthesia Post Note  Patient: Randy Smith  Procedure(s) Performed: Procedure(s) (LRB): APPENDECTOMY LAPAROSCOPIC (N/A)     Patient location during evaluation: PACU Anesthesia Type: General Level of consciousness: sedated and patient cooperative Pain management: pain level controlled Vital Signs Assessment: post-procedure vital signs reviewed and stable Respiratory status: spontaneous breathing Cardiovascular status: stable Anesthetic complications: no    Last Vitals:  Vitals:   06/06/17 0633 06/06/17 1044  BP: 121/81 115/65  Pulse: 77 61  Resp: 19 18  Temp: 36.8 C 36.6 C  SpO2: 99% 93%    Last Pain:  Vitals:   06/06/17 1245  TempSrc:   PainSc: 2                  Lewie Loron

## 2017-06-07 ENCOUNTER — Encounter (HOSPITAL_COMMUNITY): Payer: Self-pay | Admitting: Surgery

## 2017-06-07 LAB — CBC
HEMATOCRIT: 38.6 % — AB (ref 39.0–52.0)
Hemoglobin: 12.3 g/dL — ABNORMAL LOW (ref 13.0–17.0)
MCH: 25.6 pg — ABNORMAL LOW (ref 26.0–34.0)
MCHC: 31.9 g/dL (ref 30.0–36.0)
MCV: 80.4 fL (ref 78.0–100.0)
PLATELETS: 234 10*3/uL (ref 150–400)
RBC: 4.8 MIL/uL (ref 4.22–5.81)
RDW: 13.5 % (ref 11.5–15.5)
WBC: 7.2 10*3/uL (ref 4.0–10.5)

## 2018-09-04 IMAGING — CT CT ABD-PELV W/ CM
2 of 4 series · 16 of 46 positions shown, 18 images · IV contrast (Omni 300)
Comparison: None.

CLINICAL DATA: Sharp pains in the lower abdomen on both sides,
worse on the right. Symptoms began 6 p.m. yesterday.

EXAM:
CT ABDOMEN AND PELVIS WITH CONTRAST
TECHNIQUE: Multidetector CT imaging of the abdomen and pelvis was performed
using the standard protocol following bolus administration of
intravenous contrast.
CONTRAST:  100mL KPXCL7-2MM IOPAMIDOL (KPXCL7-2MM) INJECTION 61%

[Series 3: a/p w/ 5mm · axial · 0.69mm/px · z∈[+910,+1340]mm · 13 of 94 slices shown, 15 images]
[im 4/94  soft-tissue]
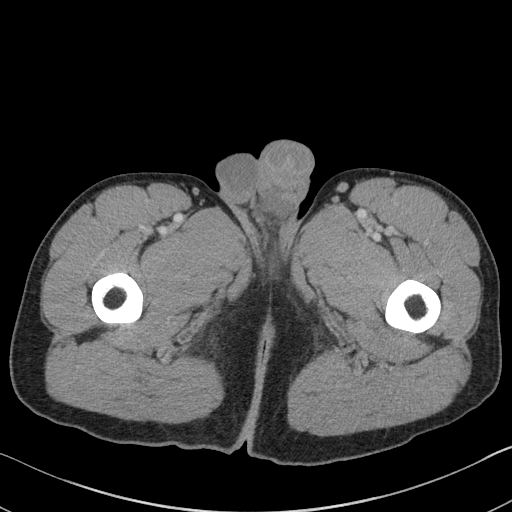
[im 4/94  bone]
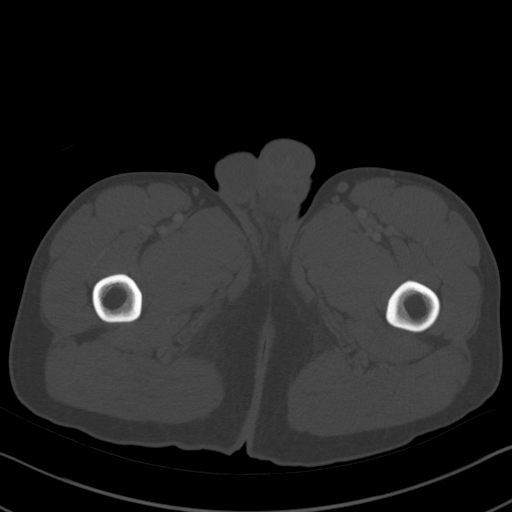
[im 12/94  soft-tissue]
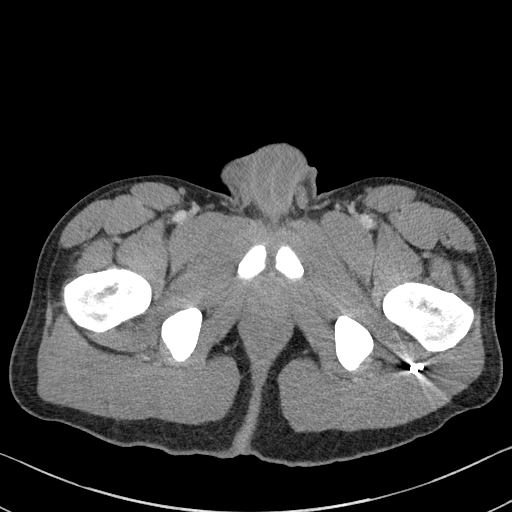
[im 19/94  soft-tissue]
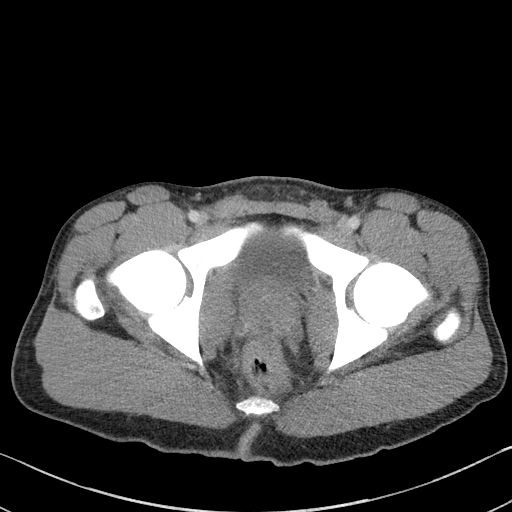
[im 27/94  soft-tissue]
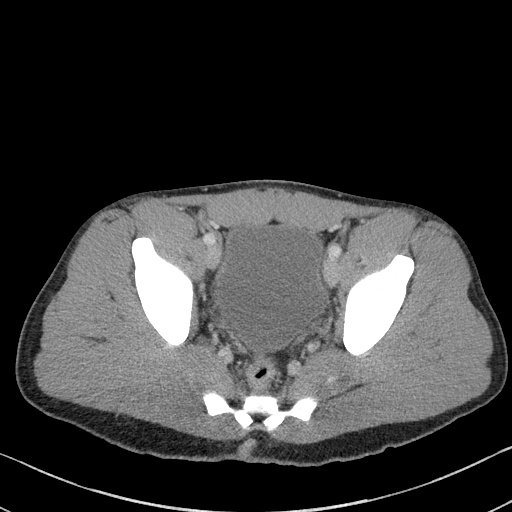
[im 34/94  soft-tissue]
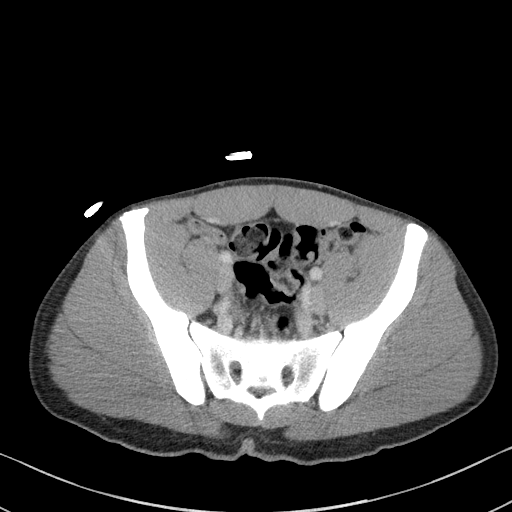
[im 41/94  soft-tissue]
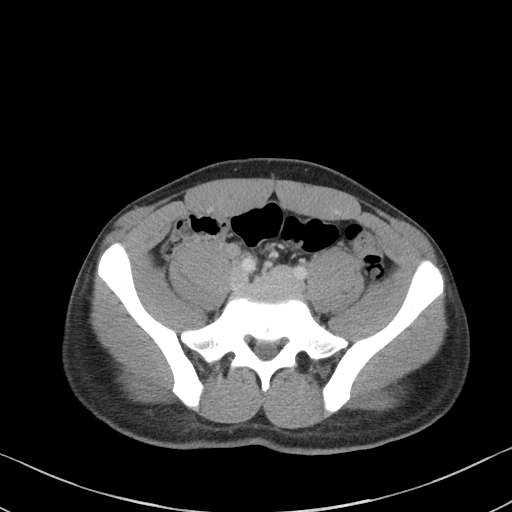
[im 49/94  soft-tissue]
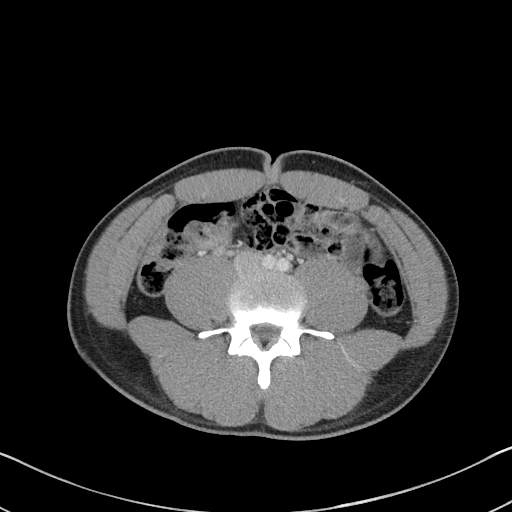
[im 53/94  soft-tissue]
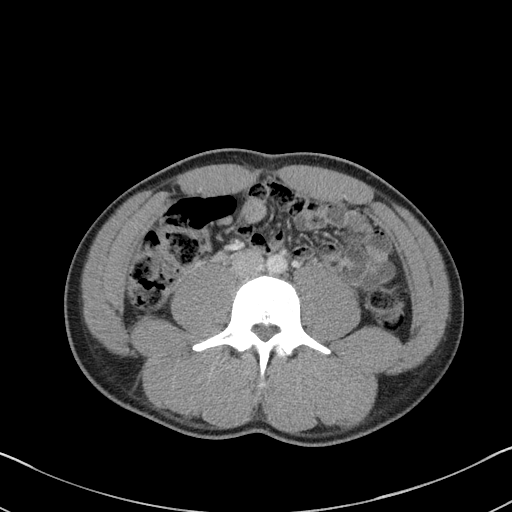
[im 60/94  soft-tissue]
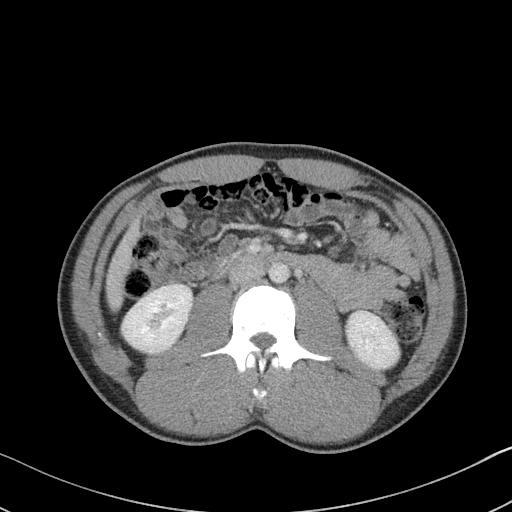
[im 60/94  bone]
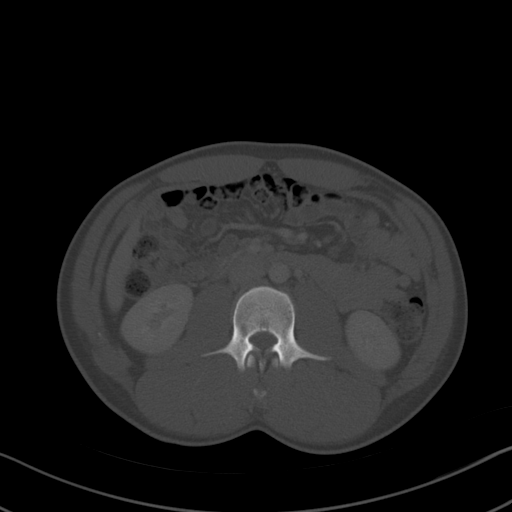
[im 67/94  soft-tissue]
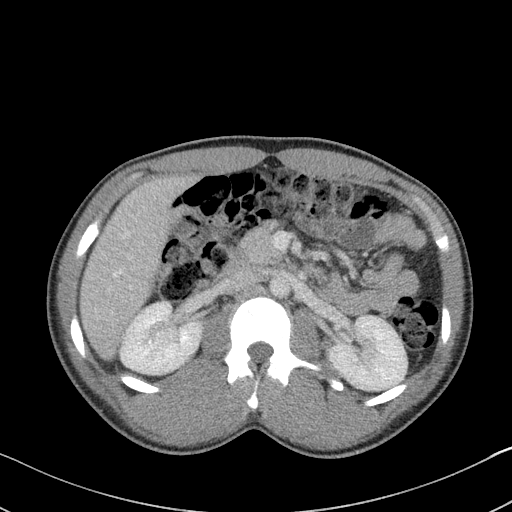
[im 75/94  soft-tissue]
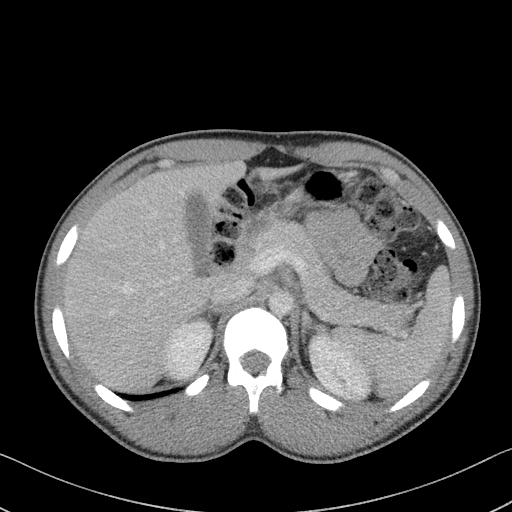
[im 82/94  soft-tissue]
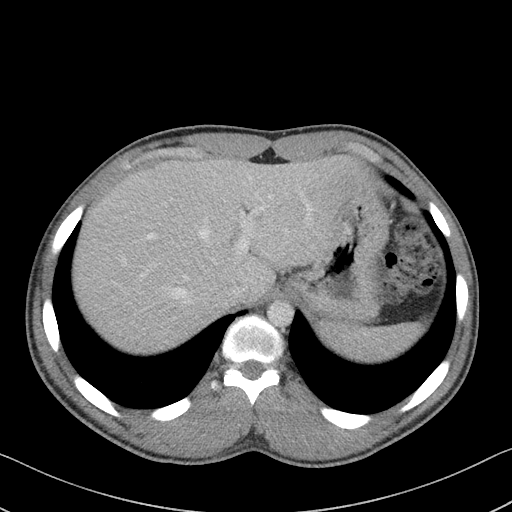
[im 90/94  soft-tissue]
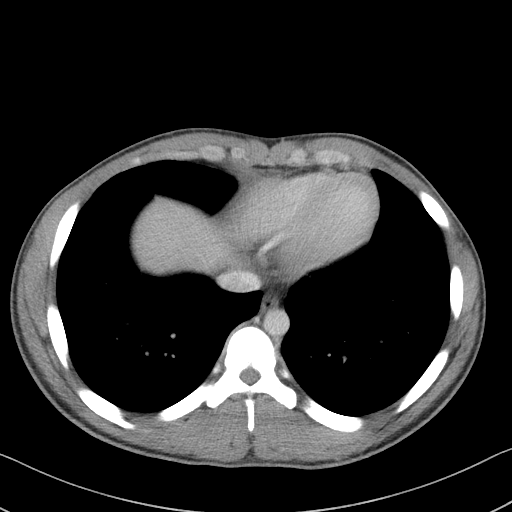

[Series 6: a/p w/ cor · coronal · 0.63mm/px · 3 of 151 slices shown]
[im 51/151  soft-tissue]
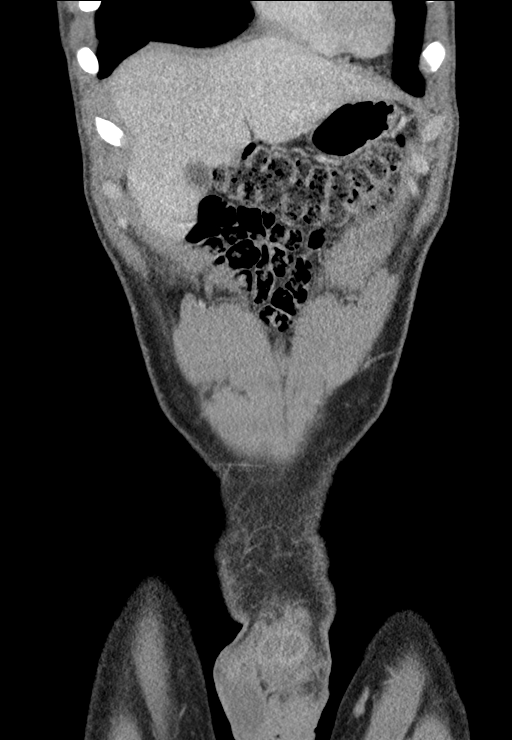
[im 67/151  soft-tissue]
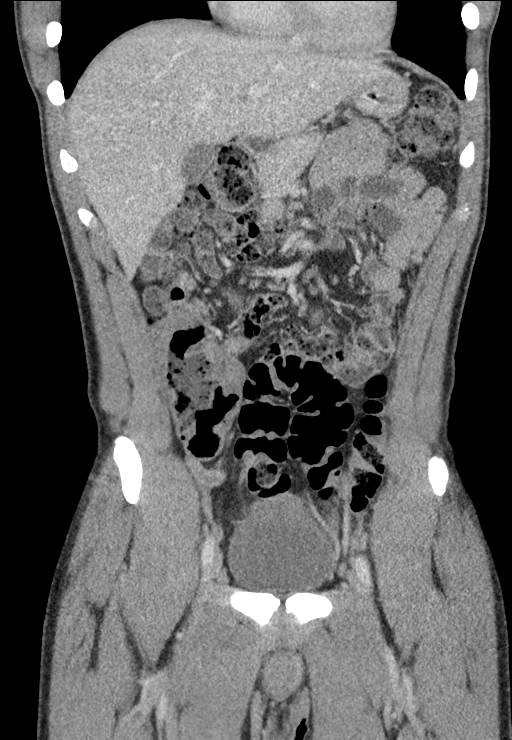
[im 84/151  soft-tissue]
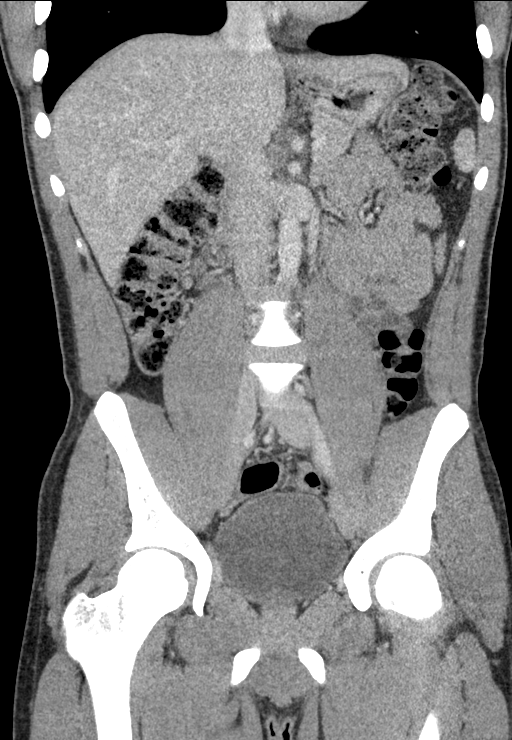

[16 of 46 positions shown; findings below may reference images not displayed]

FINDINGS: Lower chest:  No contributory findings.

Hepatobiliary: No focal liver abnormality.No evidence of biliary
obstruction or stone.

Pancreas: Unremarkable.

Spleen: Unremarkable.

Adrenals/Urinary Tract: Negative adrenals. No hydronephrosis or
stone. Unremarkable bladder.

Stomach/Bowel: The appendix extends medially then inferiorly from
the cecum and shows wall thickening within outer wall diameter of 11
mm. Some gas is present within the distal lumen, but it is primarily
fluid-filled. Stone like calcification seen at the level of the
cecum but not at the appendiceal orifice. No mesoappendix fat
inflammation. No perforation or abscess. Negative for bowel
obstruction.

Vascular/Lymphatic: No acute vascular abnormality. Mild enlargement
of ileo colic lymph nodes considered reactive in this setting.

Reproductive:No pathologic findings.

Other: No ascites or pneumoperitoneum.

Musculoskeletal: No acute abnormalities. Two retained BBs posterior
to the left proximal femur.
IMPRESSION: Findings consistent with early appendicitis in the appropriate
clinical setting. No perforation or abscess.
